# Patient Record
Sex: Male | Born: 1980 | Race: White | Hispanic: No | Marital: Single | Smoking: Current every day smoker
Health system: Southern US, Community
[De-identification: ages and names within clinical notes are randomized; demographics above are authoritative.]

## PROBLEM LIST (undated history)

## (undated) DIAGNOSIS — R011 Cardiac murmur, unspecified: Secondary | ICD-10-CM

## (undated) HISTORY — PX: ABSCESS DRAINAGE: SHX1119

---

## 2015-12-04 ENCOUNTER — Encounter (HOSPITAL_BASED_OUTPATIENT_CLINIC_OR_DEPARTMENT_OTHER): Payer: Self-pay | Admitting: Adult Health

## 2015-12-04 ENCOUNTER — Emergency Department (HOSPITAL_BASED_OUTPATIENT_CLINIC_OR_DEPARTMENT_OTHER): Payer: Managed Care, Other (non HMO)

## 2015-12-04 ENCOUNTER — Emergency Department (HOSPITAL_BASED_OUTPATIENT_CLINIC_OR_DEPARTMENT_OTHER)
Admission: EM | Admit: 2015-12-04 | Discharge: 2015-12-04 | Disposition: A | Discharge status: Home / Self Care | Payer: Managed Care, Other (non HMO) | Attending: Emergency Medicine | Admitting: Emergency Medicine

## 2015-12-04 DIAGNOSIS — W228XXA Striking against or struck by other objects, initial encounter: Secondary | ICD-10-CM | POA: Diagnosis not present

## 2015-12-04 DIAGNOSIS — S62336A Displaced fracture of neck of fifth metacarpal bone, right hand, initial encounter for closed fracture: Secondary | ICD-10-CM | POA: Insufficient documentation

## 2015-12-04 DIAGNOSIS — S62316A Displaced fracture of base of fifth metacarpal bone, right hand, initial encounter for closed fracture: Secondary | ICD-10-CM

## 2015-12-04 DIAGNOSIS — Z23 Encounter for immunization: Secondary | ICD-10-CM | POA: Insufficient documentation

## 2015-12-04 DIAGNOSIS — Y9389 Activity, other specified: Secondary | ICD-10-CM | POA: Insufficient documentation

## 2015-12-04 DIAGNOSIS — F17210 Nicotine dependence, cigarettes, uncomplicated: Secondary | ICD-10-CM | POA: Insufficient documentation

## 2015-12-04 DIAGNOSIS — S62304A Unspecified fracture of fourth metacarpal bone, right hand, initial encounter for closed fracture: Secondary | ICD-10-CM | POA: Diagnosis not present

## 2015-12-04 DIAGNOSIS — Y999 Unspecified external cause status: Secondary | ICD-10-CM | POA: Diagnosis not present

## 2015-12-04 DIAGNOSIS — S62302A Unspecified fracture of third metacarpal bone, right hand, initial encounter for closed fracture: Secondary | ICD-10-CM | POA: Diagnosis not present

## 2015-12-04 DIAGNOSIS — Y92009 Unspecified place in unspecified non-institutional (private) residence as the place of occurrence of the external cause: Secondary | ICD-10-CM | POA: Insufficient documentation

## 2015-12-04 DIAGNOSIS — S6991XA Unspecified injury of right wrist, hand and finger(s), initial encounter: Secondary | ICD-10-CM | POA: Diagnosis present

## 2015-12-04 DIAGNOSIS — S62309A Unspecified fracture of unspecified metacarpal bone, initial encounter for closed fracture: Secondary | ICD-10-CM

## 2015-12-04 HISTORY — DX: Cardiac murmur, unspecified: R01.1

## 2015-12-04 MED ORDER — BACITRACIN ZINC 500 UNIT/GM EX OINT
1.0000 "application " | TOPICAL_OINTMENT | Freq: Once | CUTANEOUS | Status: AC
  Administered 2015-12-04: 1 via TOPICAL
  Filled 2015-12-04: qty 28.35

## 2015-12-04 MED ORDER — IBUPROFEN 800 MG PO TABS
800.0000 mg | ORAL_TABLET | Freq: Once | ORAL | Status: AC
  Administered 2015-12-04: 800 mg via ORAL
  Filled 2015-12-04: qty 1

## 2015-12-04 MED ORDER — HYDROCODONE-ACETAMINOPHEN 5-325 MG PO TABS
1.0000 | ORAL_TABLET | Freq: Once | ORAL | Status: AC
  Administered 2015-12-04: 1 via ORAL
  Filled 2015-12-04: qty 1

## 2015-12-04 MED ORDER — LIDOCAINE-EPINEPHRINE 1 %-1:100000 IJ SOLN: 10.0000 mL | Freq: Once | INTRAMUSCULAR | Status: DC

## 2015-12-04 MED ORDER — TETANUS-DIPHTH-ACELL PERTUSSIS 5-2.5-18.5 LF-MCG/0.5 IM SUSP
0.5000 mL | Freq: Once | INTRAMUSCULAR | Status: AC
  Administered 2015-12-04: 0.5 mL via INTRAMUSCULAR
  Filled 2015-12-04: qty 0.5

## 2015-12-04 MED ORDER — HYDROCODONE-ACETAMINOPHEN 5-325 MG PO TABS: 1.0000 | ORAL_TABLET | ORAL | 0 refills | Status: AC | PRN

## 2015-12-04 MED ORDER — LIDOCAINE-EPINEPHRINE (PF) 1 %-1:200000 IJ SOLN
INTRAMUSCULAR | Status: AC
  Administered 2015-12-04: 18:00:00
  Filled 2015-12-04: qty 30

## 2015-12-04 NOTE — ED Provider Notes (Signed)
[facility] Provider Note   CSN: [id number] Arrival date & time: 12/04/15  1532     History   Chief Complaint Chief Complaint  Patient presents with  . Hand Injury    HPI Phillip Boyd is a 35 y.o. male.  35yo M w/ R hand injury. Just PTA, the patient was in his house trying to deal with his dogs when he struck his dorsal R hand on a counter top corner. He has had moderate, constant pain and swelling near his 5th knuckle since then. Normal sensation fingers. Unsure last tetanus shot.   The history is provided by the patient.  Hand Injury      Past Medical History:  Diagnosis Date  . Murmur, cardiac     There are no active problems to display for this patient.   History reviewed. No pertinent surgical history.     Home Medications    Prior to Admission medications   Medication Sig Start Date End Date Taking? Authorizing Provider  HYDROcodone-acetaminophen (NORCO/VICODIN) 5-325 MG tablet Take 1-2 tablets by mouth every 4 (four) hours as needed. 12/04/15   Laurence Spatesachel Morgan Anu Stagner, MD    Family History History reviewed. No pertinent family history.  Social History Social History  Substance Use Topics  . Smoking status: Current Every Day Smoker    Types: Cigarettes  . Smokeless tobacco: Never Used  . Alcohol use Yes     Allergies   Patient has no known allergies.   Review of Systems Review of Systems 10 Systems reviewed and are negative for acute change except as noted in the HPI.   Physical Exam Updated Vital Signs BP 120/87 (BP Location: Right Arm)   Pulse 66   Temp 97.8 F (36.6 C) (Oral)   Resp 18   SpO2 100%   Physical Exam  Constitutional: He is oriented to person, place, and time. He appears well-developed and well-nourished. No distress.  HENT:  Head: Normocephalic and atraumatic.  Eyes: Conjunctivae are normal.  Neck: Neck supple.  Musculoskeletal: He exhibits edema and tenderness.  Hematoma and swelling of dorsal R  hand just proximal to 5th MCP joint; limited ROM at MCP joint due to pain; other fingers normal ROM; normal ROM and wrist and elbow; 2+ radial pulse, normal sensation throughout  Neurological: He is alert and oriented to person, place, and time. No sensory deficit.  Skin: Skin is warm and dry.  Psychiatric: He has a normal mood and affect. Judgment normal.  Nursing note and vitals reviewed.    ED Treatments / Results  Labs (all labs ordered are listed, but only abnormal results are displayed) Labs Reviewed - No data to display  EKG  EKG Interpretation None       Radiology Dg Hand Complete Right  Result Date: 12/04/2015 CLINICAL DATA:  Acute right hand pain following injury today. Initial encounter. EXAM: RIGHT HAND - COMPLETE 3+ VIEW COMPARISON:  None. FINDINGS: An oblique fracture of the fifth metacarpal neck is noted with apex dorsal angulation and 1.5 mm radial displacement. Fractures of the proximal third and fourth metacarpals are noted. No subluxation or dislocation identified. IMPRESSION: Fractures of the fifth metacarpal neck and proximal third and fourth metacarpals as described. Electronically Signed   By: Harmon PierJeffrey  Hu M.D.   On: 12/04/2015 16:16    Procedures Reduction of fracture Date/Time: 12/04/2015 6:14 PM Performed by: Laurence SpatesLITTLE, Lexie Koehl MORGAN Authorized by: Laurence SpatesLITTLE, Lovada Barwick MORGAN  Consent: Verbal consent obtained. Risks and benefits: risks, benefits and alternatives were discussed  Consent given by: patient Patient identity confirmed: verbally with patient Local anesthesia used: yes Anesthesia: hematoma block  Anesthesia: Local anesthesia used: yes Local Anesthetic: lidocaine 1% with epinephrine Anesthetic total: 8 mL  Sedation: Patient sedated: no Patient tolerance: Patient tolerated the procedure well with no immediate complications Comments: Manipulated distal portion of 5th metacarpal; pt tolerated well     SPLINT APPLICATION Date/Time: 6:16  PM Authorized by: Ambrose Finlandachel Morgan Myrtle Haller Consent: Verbal consent obtained. Risks and benefits: risks, benefits and alternatives were discussed Consent given by: patient Splint applied by: myself and orthopedic technician Location details: R hand Splint type: ulnar gutter splint Supplies used: sleeve, padding, orthoglass, ACE wrap Post-procedure: The splinted body part was neurovascularly unchanged following the procedure. Patient tolerance: Patient tolerated the procedure well with no immediate complications.   Medications Ordered in ED Medications  bacitracin ointment 1 application (not administered)  lidocaine-EPINEPHrine (XYLOCAINE W/EPI) 1 %-1:100000 (with pres) injection 10 mL (not administered)  lidocaine-EPINEPHrine (XYLOCAINE-EPINEPHrine) 1 %-1:200000 (PF) injection (not administered)  Tdap (BOOSTRIX) injection 0.5 mL (0.5 mLs Intramuscular Given 12/04/15 1559)  HYDROcodone-acetaminophen (NORCO/VICODIN) 5-325 MG per tablet 1 tablet (1 tablet Oral Given 12/04/15 1607)  ibuprofen (ADVIL,MOTRIN) tablet 800 mg (800 mg Oral Given 12/04/15 1607)     Initial Impression / Assessment and Plan / ED Course  I have reviewed the triage vital signs and the nursing notes.  Pertinent imaging results that were available during my care of the patient were reviewed by me and considered in my medical decision making (see chart for details).  Clinical Course    Pt w/ R hand pain after striking hand on counter today. Significant dorsal swelling over 5th metacarpal. Neurovascularly intact distally. XR shows angulated distal 5th metacarpal fx through neck, Fractures of the right proximal third and fourth metacarpals that are nondisplaced. I discussed with hand surgeon on call, Dr. Izora Ribasoley, who agreed with plan to reduce, splint and f/u in clinic. See procedure note for details. Struck in on ice, elevation, and pain control and provided with follow-up information to see hand surgery on Monday in the  clinic. Return precautions reviewed including any signs of neurovascular compromise. Patient voiced understanding and was discharged in satisfactory condition.  Final Clinical Impressions(s) / ED Diagnoses   Final diagnoses:  Displaced fracture of base of fifth metacarpal bone, right hand, initial encounter for closed fracture  Multiple closed fractures of single metacarpal bone, initial encounter    New Prescriptions New Prescriptions   HYDROCODONE-ACETAMINOPHEN (NORCO/VICODIN) 5-325 MG TABLET    Take 1-2 tablets by mouth every 4 (four) hours as needed.     Laurence Spatesachel Morgan Kipton Skillen, MD 12/04/15 [phone]

## 2015-12-04 NOTE — ED Notes (Signed)
Dr. Izora Ribasoley called for hand surgeon consult @ 16:30

## 2015-12-04 NOTE — ED Notes (Signed)
Patient transported to X-ray 

## 2015-12-04 NOTE — ED Triage Notes (Signed)
Presents with right hand injury to metarsal area of pinky finger, pinky is unable to move, knuckle is swollen and bleeding slightly.  Brisk refill and +2 radial pulse. Hand was hit on the top of a counter top corner

## 2015-12-06 ENCOUNTER — Encounter (HOSPITAL_BASED_OUTPATIENT_CLINIC_OR_DEPARTMENT_OTHER): Payer: Self-pay | Admitting: *Deleted

## 2015-12-06 ENCOUNTER — Emergency Department (HOSPITAL_BASED_OUTPATIENT_CLINIC_OR_DEPARTMENT_OTHER)
Admission: EM | Admit: 2015-12-06 | Discharge: 2015-12-06 | Disposition: A | Discharge status: Home / Self Care | Payer: Managed Care, Other (non HMO) | Attending: Emergency Medicine | Admitting: Emergency Medicine

## 2015-12-06 DIAGNOSIS — Z4800 Encounter for change or removal of nonsurgical wound dressing: Secondary | ICD-10-CM | POA: Insufficient documentation

## 2015-12-06 DIAGNOSIS — F17210 Nicotine dependence, cigarettes, uncomplicated: Secondary | ICD-10-CM | POA: Diagnosis not present

## 2015-12-06 DIAGNOSIS — M79644 Pain in right finger(s): Secondary | ICD-10-CM | POA: Diagnosis not present

## 2015-12-06 DIAGNOSIS — Z5189 Encounter for other specified aftercare: Secondary | ICD-10-CM

## 2015-12-06 NOTE — ED Notes (Signed)
No s/s of infection noted to right hand wound, per ED MD

## 2015-12-06 NOTE — ED Provider Notes (Signed)
[facility]   CSN: [id number] Arrival date & time: 12/06/15  1301     History   Chief Complaint Chief Complaint  Patient presents with  . Wound Check    HPI Phillip Boyd is a 35 y.o. male.  HPI Patient was seen 2 days ago after striking his right hand on a countertop sustaining multiple fractures. Was splinted and given follow-up with hand surgery on Monday. Patient states he's had swelling and some discoloration to his second through fourth digits to the right hand. Denies any numbness or decreased mobility. Pain is well controlled. Past Medical History:  Diagnosis Date  . Murmur, cardiac     There are no active problems to display for this patient.   Past Surgical History:  Procedure Laterality Date  . ABSCESS DRAINAGE     back age 89       Home Medications    Prior to Admission medications   Medication Sig Start Date End Date Taking? Authorizing Provider  HYDROcodone-acetaminophen (NORCO/VICODIN) 5-325 MG tablet Take 1-2 tablets by mouth every 4 (four) hours as needed. 12/04/15  Yes Laurence Spatesachel Morgan Little, MD    Family History No family history on file.  Social History Social History  Substance Use Topics  . Smoking status: Current Every Day Smoker    Types: Cigarettes  . Smokeless tobacco: Never Used  . Alcohol use Yes     Allergies   Patient has no known allergies.   Review of Systems Review of Systems  Musculoskeletal: Positive for arthralgias.  Skin: Positive for color change.  Neurological: Negative for weakness and numbness.  All other systems reviewed and are negative.    Physical Exam Updated Vital Signs BP 98/65 (BP Location: Left Arm)   Pulse (!) 52   Temp 98.7 F (37.1 C) (Oral)   Resp 20   SpO2 99%   Physical Exam  Constitutional: He is oriented to person, place, and time. He appears well-developed and well-nourished.  HENT:  Head: Normocephalic and atraumatic.  Eyes: EOM are normal. Pupils are  equal, round, and reactive to light.  Neck: Normal range of motion. Neck supple.  Cardiovascular: Normal rate and regular rhythm.   Pulmonary/Chest: Effort normal.  Abdominal: Soft.  Musculoskeletal: Normal range of motion. He exhibits tenderness. He exhibits no edema.  Patient has purplish discoloration of the second through fourth digits of the right hand. Splint is in place. Good distal cap refill. No warmth or coolness. Good movement of the fingers  Neurological: He is alert and oriented to person, place, and time.  Sensation intact to distal tips of the fingers of the right hand. Good flexion and extension.  Skin: Skin is warm and dry. No rash noted. No erythema.  Psychiatric: He has a normal mood and affect. His behavior is normal.  Nursing note and vitals reviewed.    ED Treatments / Results  Labs (all labs ordered are listed, but only abnormal results are displayed) Labs Reviewed - No data to display  EKG  EKG Interpretation None       Radiology Dg Hand Complete Right  Result Date: 12/04/2015 CLINICAL DATA:  Acute right hand pain following injury today. Initial encounter. EXAM: RIGHT HAND - COMPLETE 3+ VIEW COMPARISON:  None. FINDINGS: An oblique fracture of the fifth metacarpal neck is noted with apex dorsal angulation and 1.5 mm radial displacement. Fractures of the proximal third and fourth metacarpals are noted. No subluxation or dislocation identified. IMPRESSION: Fractures of the fifth metacarpal  neck and proximal third and fourth metacarpals as described. Electronically Signed   By: Harmon PierJeffrey  Hu M.D.   On: 12/04/2015 16:16    Procedures Procedures (including critical care time)  Medications Ordered in ED Medications - No data to display   Initial Impression / Assessment and Plan / ED Course  I have reviewed the triage vital signs and the nursing notes.  Pertinent labs & imaging results that were available during my care of the patient were reviewed by me and  considered in my medical decision making (see chart for details).  Clinical Course     Patient's splint was removed and hand examined. Splint was then replaced and Ace wrap applied. Discoloration and swelling likely due to gravitational movement of blood from fracture sites. No evidence of compartment syndrome. Patient pain is well-controlled with current medication. Follow-up with hand surgery as previously instructed on Monday. Return precautions given.  Final Clinical Impressions(s) / ED Diagnoses   Final diagnoses:  Encounter for wound re-check    New Prescriptions Discharge Medication List as of 12/06/2015  3:04 PM       Loren Raceravid Jefrey Raburn, MD 12/06/15 [phone]

## 2015-12-06 NOTE — ED Triage Notes (Signed)
Patient states he was seen here for hand fracture two days ago.  States last night he felt that he was having increased swelling and tingling in his fingers.  Here today for wound recheck.

## 2015-12-06 NOTE — ED Notes (Signed)
Pt given d/c instructions as per chart. Verbalizes understanding. No questions. 

## 2018-04-20 IMAGING — DX DG HAND COMPLETE 3+V*R*
3 series · 3 of 3 positions shown · non-contrast
Comparison: None.

CLINICAL DATA: Acute right hand pain following injury today.
Initial encounter.

EXAM:
RIGHT HAND - COMPLETE 3+ VIEW

[hand pa]
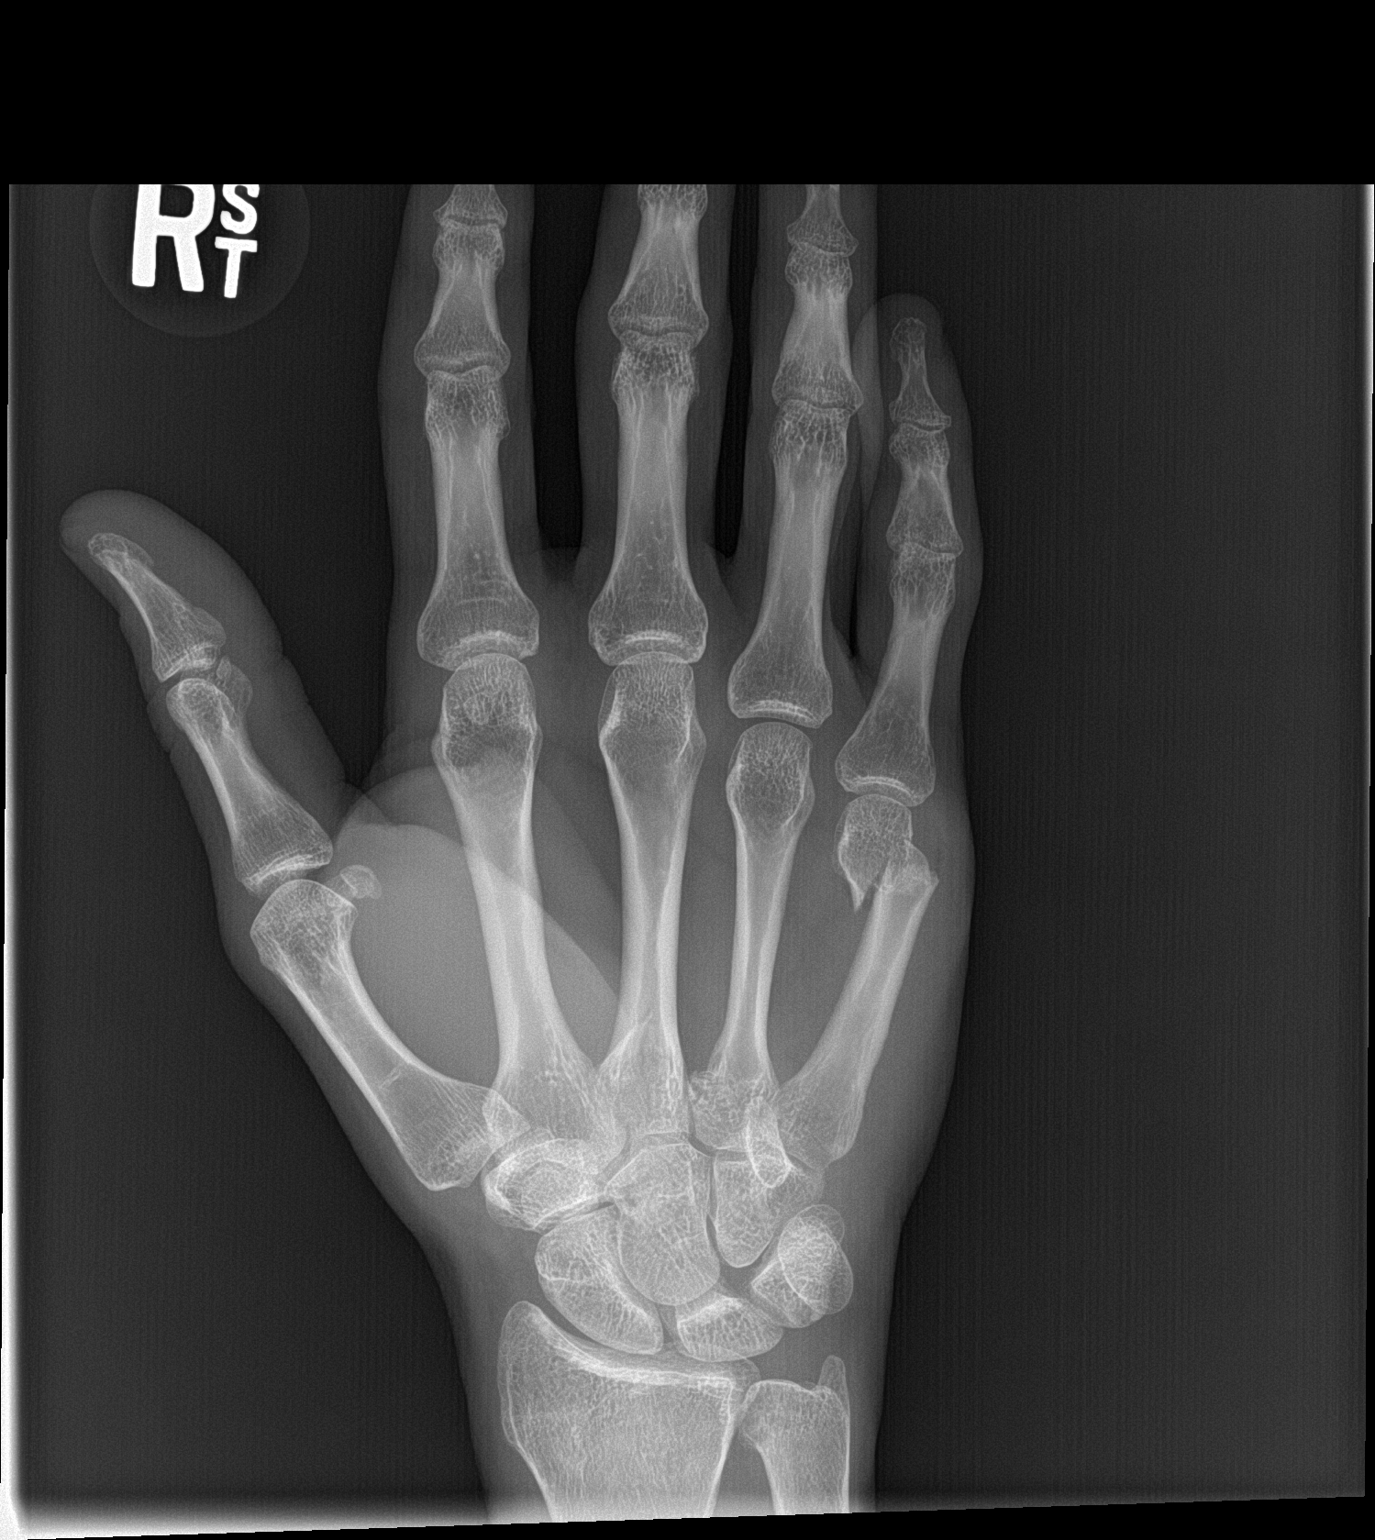

[hand obl]
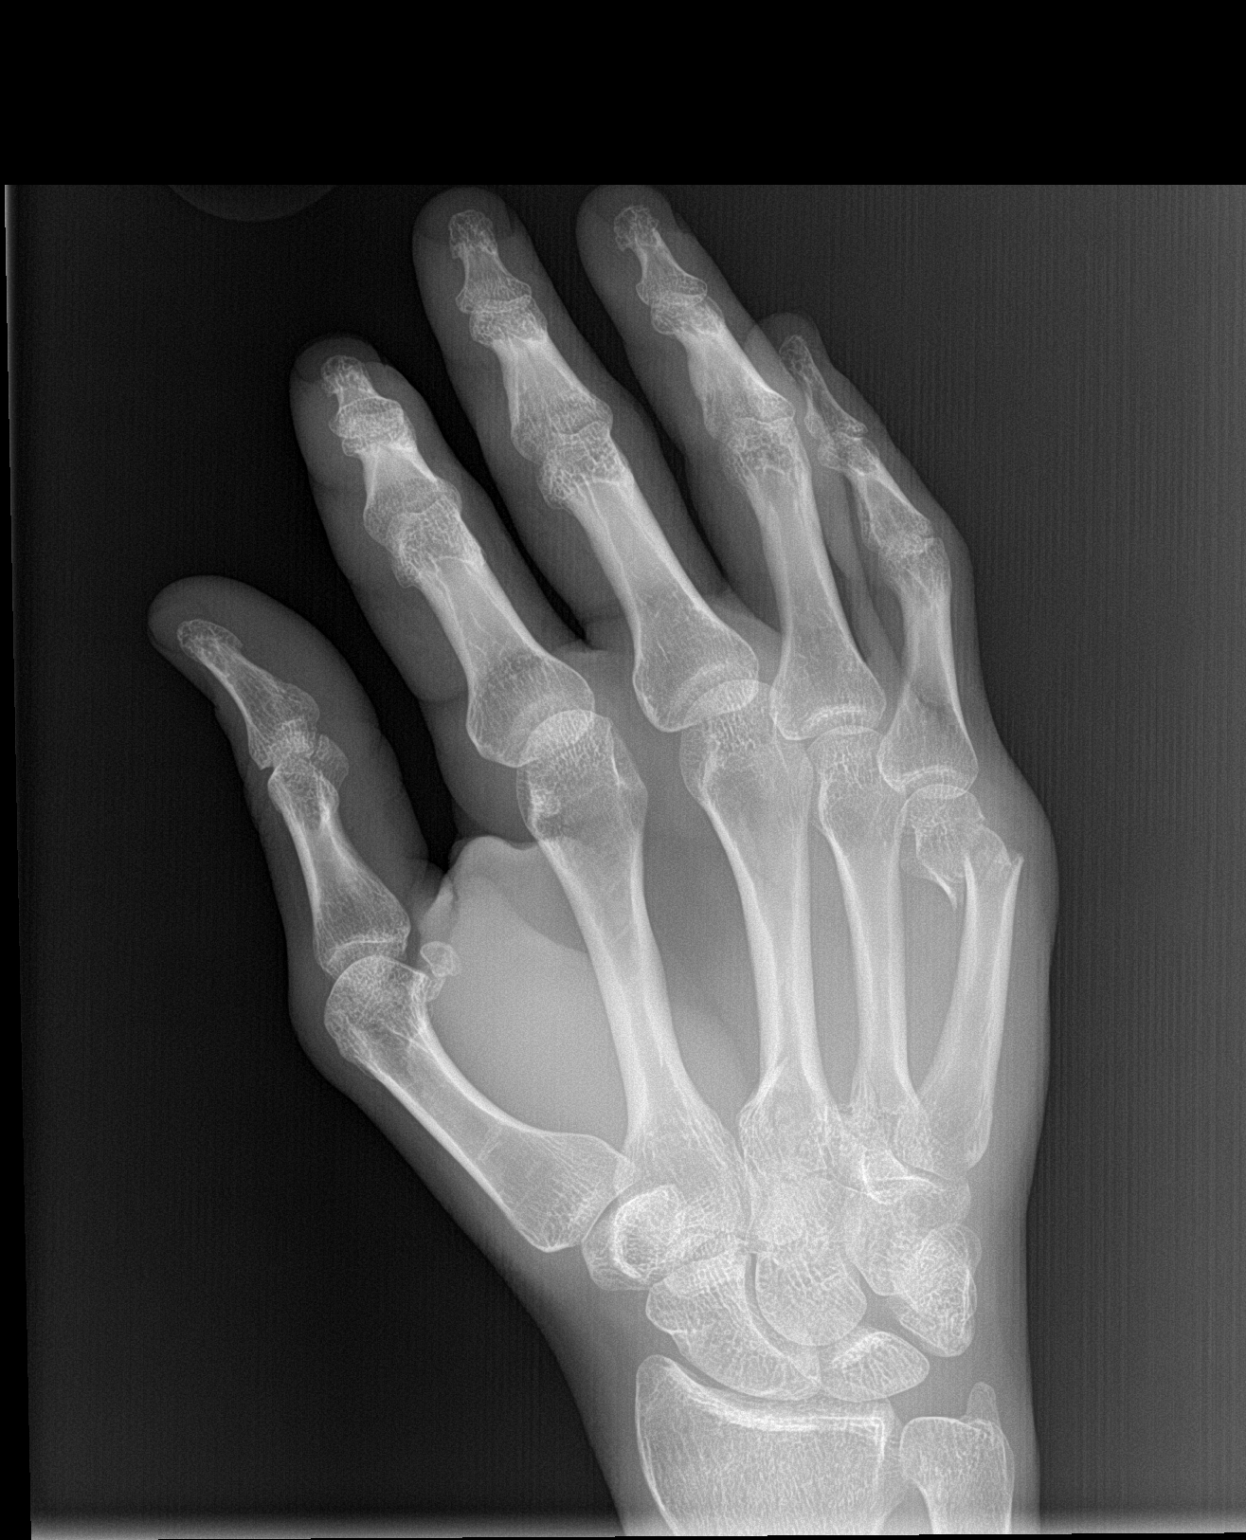

[hand lat]
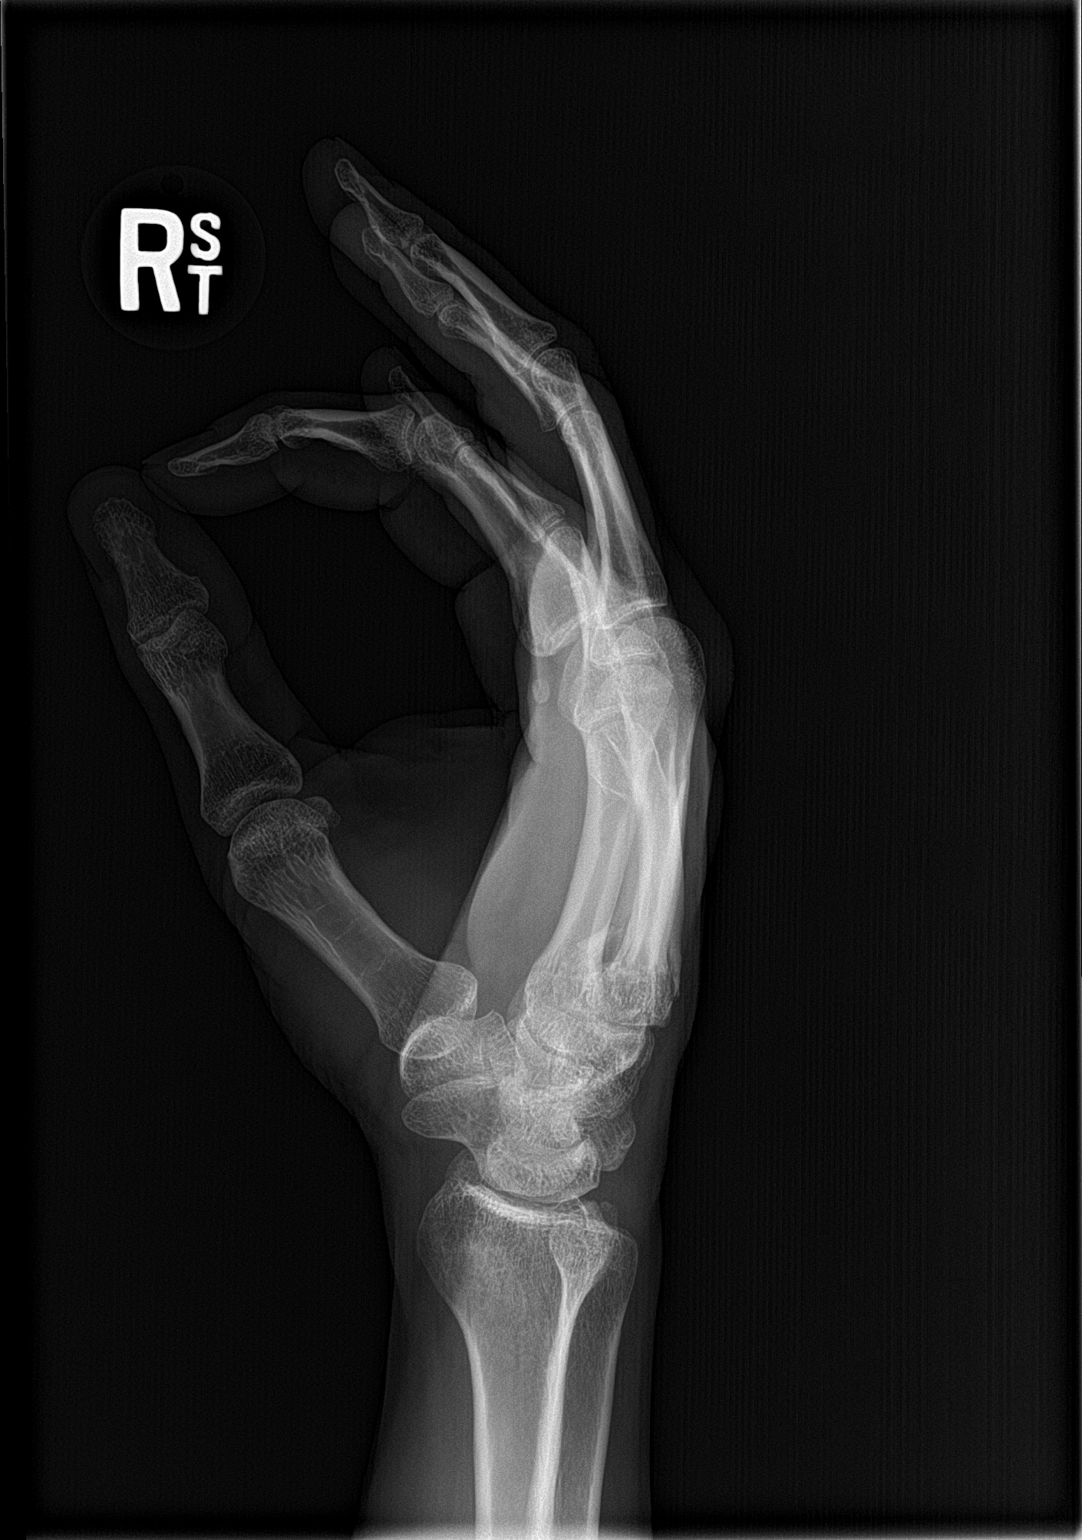

[3 of 3 positions shown; findings below may reference images not displayed]

FINDINGS: An oblique fracture of the fifth metacarpal neck is noted with apex
dorsal angulation and 1.5 mm radial displacement.

Fractures of the proximal third and fourth metacarpals are noted.

No subluxation or dislocation identified.
IMPRESSION: Fractures of the fifth metacarpal neck and proximal third and fourth
metacarpals as described.
